# Patient Record
Sex: Male | Born: 1965 | Race: Black or African American | Hispanic: No | Marital: Married | Smoking: Current every day smoker
Health system: Southern US, Community
[De-identification: ages and names within clinical notes are randomized; demographics above are authoritative.]

## PROBLEM LIST (undated history)

## (undated) DIAGNOSIS — E559 Vitamin D deficiency, unspecified: Secondary | ICD-10-CM

## (undated) DIAGNOSIS — I10 Essential (primary) hypertension: Secondary | ICD-10-CM

## (undated) DIAGNOSIS — E781 Pure hyperglyceridemia: Secondary | ICD-10-CM

## (undated) DIAGNOSIS — E7800 Pure hypercholesterolemia, unspecified: Secondary | ICD-10-CM

## (undated) DIAGNOSIS — R7303 Prediabetes: Secondary | ICD-10-CM

## (undated) DIAGNOSIS — R000 Tachycardia, unspecified: Secondary | ICD-10-CM

## (undated) DIAGNOSIS — Z720 Tobacco use: Secondary | ICD-10-CM

## (undated) HISTORY — PX: OTHER SURGICAL HISTORY: SHX169

## (undated) HISTORY — DX: Vitamin D deficiency, unspecified: E55.9

## (undated) HISTORY — PX: SHOULDER SURGERY: SHX246

---

## 2012-12-25 ENCOUNTER — Encounter (HOSPITAL_COMMUNITY): Payer: Self-pay | Admitting: Emergency Medicine

## 2012-12-25 ENCOUNTER — Emergency Department (HOSPITAL_COMMUNITY): Payer: Worker's Compensation

## 2012-12-25 ENCOUNTER — Emergency Department (HOSPITAL_COMMUNITY)
Admission: EM | Admit: 2012-12-25 | Discharge: 2012-12-25 | Disposition: A | Discharge status: Home / Self Care | Payer: Worker's Compensation | Attending: Emergency Medicine | Admitting: Emergency Medicine

## 2012-12-25 DIAGNOSIS — IMO0001 Reserved for inherently not codable concepts without codable children: Secondary | ICD-10-CM | POA: Insufficient documentation

## 2012-12-25 DIAGNOSIS — Z79899 Other long term (current) drug therapy: Secondary | ICD-10-CM | POA: Insufficient documentation

## 2012-12-25 DIAGNOSIS — Y939 Activity, unspecified: Secondary | ICD-10-CM | POA: Insufficient documentation

## 2012-12-25 DIAGNOSIS — M7710 Lateral epicondylitis, unspecified elbow: Secondary | ICD-10-CM | POA: Insufficient documentation

## 2012-12-25 DIAGNOSIS — W230XXA Caught, crushed, jammed, or pinched between moving objects, initial encounter: Secondary | ICD-10-CM | POA: Insufficient documentation

## 2012-12-25 DIAGNOSIS — Z87828 Personal history of other (healed) physical injury and trauma: Secondary | ICD-10-CM | POA: Insufficient documentation

## 2012-12-25 DIAGNOSIS — R209 Unspecified disturbances of skin sensation: Secondary | ICD-10-CM | POA: Insufficient documentation

## 2012-12-25 DIAGNOSIS — F17200 Nicotine dependence, unspecified, uncomplicated: Secondary | ICD-10-CM | POA: Insufficient documentation

## 2012-12-25 DIAGNOSIS — Y929 Unspecified place or not applicable: Secondary | ICD-10-CM | POA: Insufficient documentation

## 2012-12-25 DIAGNOSIS — M7711 Lateral epicondylitis, right elbow: Secondary | ICD-10-CM
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
## 2012-12-25 NOTE — ED Notes (Signed)
Pt reports hit right elbow on back of truck 2 weeks ago. Pt c/o pain to right shoulder and numbness to right hand and fingers.

## 2012-12-25 NOTE — ED Notes (Signed)
PT ambulated with baseline gait; VSS; A&Ox3; no signs of distress; respirations even and unlabored; skin warm and dry; no questions upon discharge.  

## 2012-12-26 NOTE — ED Provider Notes (Signed)
Medical screening examination/treatment/procedure(s) were performed by non-physician practitioner and as supervising physician I was immediately available for consultation/collaboration.   Jovann Luse, MD 12/26/12 1418 

## 2013-12-13 ENCOUNTER — Emergency Department (HOSPITAL_COMMUNITY)
Admission: EM | Admit: 2013-12-13 | Discharge: 2013-12-13 | Disposition: A | Discharge status: Home / Self Care | Payer: BC Managed Care – PPO | Attending: Emergency Medicine | Admitting: Emergency Medicine

## 2013-12-13 ENCOUNTER — Encounter (HOSPITAL_COMMUNITY): Payer: Self-pay | Admitting: Emergency Medicine

## 2013-12-13 DIAGNOSIS — F17200 Nicotine dependence, unspecified, uncomplicated: Secondary | ICD-10-CM | POA: Insufficient documentation

## 2013-12-13 DIAGNOSIS — R252 Cramp and spasm: Secondary | ICD-10-CM | POA: Insufficient documentation

## 2013-12-13 DIAGNOSIS — Z87828 Personal history of other (healed) physical injury and trauma: Secondary | ICD-10-CM | POA: Insufficient documentation

## 2013-12-13 DIAGNOSIS — Z880 Allergy status to penicillin: Secondary | ICD-10-CM | POA: Insufficient documentation

## 2013-12-13 DIAGNOSIS — Z79899 Other long term (current) drug therapy: Secondary | ICD-10-CM | POA: Insufficient documentation

## 2013-12-13 LAB — BASIC METABOLIC PANEL
Anion gap: 15 (ref 5–15)
BUN: 27 mg/dL — AB (ref 6–23)
CHLORIDE: 97 meq/L (ref 96–112)
CO2: 21 mEq/L (ref 19–32)
Calcium: 9.1 mg/dL (ref 8.4–10.5)
Creatinine, Ser: 1.09 mg/dL (ref 0.50–1.35)
GFR, EST NON AFRICAN AMERICAN: 80 mL/min — AB (ref >90)
Glucose, Bld: 108 mg/dL — ABNORMAL HIGH (ref 70–99)
POTASSIUM: 4.8 meq/L (ref 3.7–5.3)
SODIUM: 133 meq/L — AB (ref 137–147)

## 2013-12-13 LAB — URINALYSIS, ROUTINE W REFLEX MICROSCOPIC
Bilirubin Urine: NEGATIVE
Glucose, UA: NEGATIVE mg/dL
HGB URINE DIPSTICK: NEGATIVE
Ketones, ur: NEGATIVE mg/dL
LEUKOCYTES UA: NEGATIVE
NITRITE: NEGATIVE
PROTEIN: NEGATIVE mg/dL
SPECIFIC GRAVITY, URINE: 1.019 (ref 1.005–1.030)
UROBILINOGEN UA: 0.2 mg/dL (ref 0.0–1.0)
pH: 5.5 (ref 5.0–8.0)

## 2013-12-13 LAB — CBC WITH DIFFERENTIAL/PLATELET
BASOS PCT: 0 % (ref 0–1)
Basophils Absolute: 0 10*3/uL (ref 0.0–0.1)
Eosinophils Absolute: 0.1 10*3/uL (ref 0.0–0.7)
Eosinophils Relative: 1 % (ref 0–5)
HCT: 42.3 % (ref 39.0–52.0)
HEMOGLOBIN: 14.6 g/dL (ref 13.0–17.0)
LYMPHS ABS: 2.5 10*3/uL (ref 0.7–4.0)
Lymphocytes Relative: 25 % (ref 12–46)
MCH: 30.4 pg (ref 26.0–34.0)
MCHC: 34.5 g/dL (ref 30.0–36.0)
MCV: 88.1 fL (ref 78.0–100.0)
MONOS PCT: 10 % (ref 3–12)
Monocytes Absolute: 1 10*3/uL (ref 0.1–1.0)
NEUTROS ABS: 6.3 10*3/uL (ref 1.7–7.7)
NEUTROS PCT: 64 % (ref 43–77)
PLATELETS: 166 10*3/uL (ref 150–400)
RBC: 4.8 MIL/uL (ref 4.22–5.81)
RDW: 13.7 % (ref 11.5–15.5)
WBC: 9.9 10*3/uL (ref 4.0–10.5)

## 2013-12-13 NOTE — Discharge Instructions (Signed)

## 2013-12-13 NOTE — ED Notes (Signed)
The pt is c/o bi-lateral leg cramps since he mowed his lawn earlier today.  Since he finished mowing he has continued to have the cramps he has been drinking water gatoraid also with no relief

## 2013-12-13 NOTE — ED Notes (Signed)
C/o generalized muscle cramps states he was outside in the heat all day.

## 2013-12-13 NOTE — ED Notes (Signed)
Patient left before getting discharge instructions. States he had somewhere to be.

## 2013-12-13 NOTE — ED Provider Notes (Signed)
Patient developed cramps in both lower legs today after mowing the lawn in the heat. He is asymptomatic now without treatment. Symptoms consistent with heat cramps  Christian SouSam Marella Vanderpol, MD 12/13/13 2215
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
## 2013-12-21 NOTE — ED Provider Notes (Signed)
I have personally seen and examined the patient.  I have discussed the plan of care with the resident.  I have reviewed the documentation on PMH/FH/Soc. History.  I have reviewed the documentation of the resident and agree.  Doug SouSam Tomer Chalmers, MD 12/21/13 2019

## 2015-02-28 ENCOUNTER — Emergency Department (INDEPENDENT_AMBULATORY_CARE_PROVIDER_SITE_OTHER)
Admission: EM | Admit: 2015-02-28 | Discharge: 2015-02-28 | Disposition: A | Discharge status: Home / Self Care | Payer: Managed Care, Other (non HMO) | Source: Home / Self Care | Attending: Emergency Medicine | Admitting: Emergency Medicine

## 2015-02-28 ENCOUNTER — Encounter (HOSPITAL_COMMUNITY): Payer: Self-pay | Admitting: Emergency Medicine

## 2015-02-28 DIAGNOSIS — I10 Essential (primary) hypertension: Secondary | ICD-10-CM

## 2015-02-28 DIAGNOSIS — E785 Hyperlipidemia, unspecified: Secondary | ICD-10-CM | POA: Diagnosis not present

## 2015-02-28 HISTORY — DX: Pure hypercholesterolemia, unspecified: E78.00

## 2015-02-28 HISTORY — DX: Essential (primary) hypertension: I10

## 2015-02-28 LAB — POCT I-STAT, CHEM 8
BUN: 23 mg/dL — ABNORMAL HIGH (ref 6–20)
CHLORIDE: 105 mmol/L (ref 101–111)
Calcium, Ion: 1.25 mmol/L — ABNORMAL HIGH (ref 1.12–1.23)
Creatinine, Ser: 1.2 mg/dL (ref 0.61–1.24)
Glucose, Bld: 89 mg/dL (ref 65–99)
HEMATOCRIT: 42 % (ref 39.0–52.0)
HEMOGLOBIN: 14.3 g/dL (ref 13.0–17.0)
POTASSIUM: 4.4 mmol/L (ref 3.5–5.1)
Sodium: 139 mmol/L (ref 135–145)
TCO2: 26 mmol/L (ref 0–100)

## 2015-02-28 MED ORDER — ENALAPRIL MALEATE 20 MG PO TABS: 20.0000 mg | ORAL_TABLET | Freq: Every day | ORAL | Status: AC

## 2015-02-28 NOTE — Discharge Instructions (Signed)
Dyslipidemia Dyslipidemia is an imbalance of the lipids in your blood. Lipids are waxy, fat-like proteins that your body needs in small amounts. Dyslipidemia often involves the lipids cholesterol or triglycerides. Common forms of dyslipidemia are:  High levels of bad cholesterol (LDL cholesterol). LDL cholesterol is the type of cholesterol that causes heart disease.  Low levels of good cholesterol (HDL cholesterol). HDL cholesterol is the type of cholesterol that helps protect against heart disease.  High levels of triglycerides. Triglycerides are a fatty substance in the blood linked to a buildup of plaque on your arteries. RISK FACTORS  Increased age.  Having a family history of high cholesterol.  Certain medicines, including birth control pills, diuretics, beta-blockers, and some medicines for depression.  Smoking.  Eating a high-fat diet.  Being overweight.  Medical conditions such as diabetes, polycystic ovary syndrome, pregnancy, kidney disease, and hypothyroidism.  Lack of regular exercise. SIGNS AND SYMPTOMS There are no signs or symptoms with dyslipidemia.  DIAGNOSIS  A simple blood test called a fasting blood test can be done to determine your level of:  Total cholesterol. This is the combined number of LDL cholesterol and HDL cholesterol. A healthy number is lower than 200.  LDL cholesterol. The goal number for LDL cholesterol is different for each person depending on risk factors. Ask your health care provider what your LDL cholesterol number should be.  HDL cholesterol. A healthy level of HDL cholesterol is 60 or higher. A number lower than 40 for men or 50 for women is a danger sign.  Triglycerides. A healthy triglyceride number is less than 150. TREATMENT  Dyslipidemia is a treatable condition. Your health care provider will advise you on what type of treatment is best based on your age, your test results, and current guidelines. Treatment may include:   Dietary  changes. A dietitian can help you create a meal plan. You may need to:  Eat more foods that contain omega-3s, such as salmon and other fish.  Replace saturated fats and trans fats in your diet with healthy fats such as nuts, seeds, avocados, olive oil, and canola oil.  Regular exercise. This can help lower your LDL cholesterol, raise your HDL cholesterol, and help with weight management. Check with your health care provider before beginning an exercise program. Most people should participate in 30 minutes of brisk exercise 5 days a week.  Quitting smoking.  Medicines to lower LDL cholesterol and triglycerides. Your health care provider will monitor your lipid levels with regular blood tests. HOME CARE INSTRUCTIONS  Eat a healthy diet. Follow any diet instructions if they were given to you by your health care provider.  Maintain a healthy weight.  Exercise regularly based on the recommendations of your health care provider.  Do not use any tobacco products, including cigarettes, chewing tobacco, or electronic cigarettes.  Take medicines only as directed by your health care provider.  Keep all follow-up visits as directed by your health care provider. SEEK MEDICAL CARE IF: You are having possible side effects from your medicines. Document Released: 05/27/2013 Document Revised: 10/06/2013 Document Reviewed: 05/27/2013 [employer] Patient Information 2015 [employer]. This information is not intended to replace advice given to you by your health care provider. Make sure you discuss any questions you have with your health care provider. Hypertension Hypertension, commonly called high blood pressure, is when the force of blood pumping through your arteries is too strong. Your arteries are the blood vessels that carry blood from your heart throughout your body. A   A blood pressure reading consists of a higher number over a lower number, such as 110/72. The higher number (systolic) is the  pressure inside your arteries when your heart pumps. The lower number (diastolic) is the pressure inside your arteries when your heart relaxes. Ideally you want your blood pressure below 120/80. Hypertension forces your heart to work harder to pump blood. Your arteries may become narrow or stiff. Having hypertension puts you at risk for heart disease, stroke, and other problems.  RISK FACTORS Some risk factors for high blood pressure are controllable. Others are not.  Risk factors you cannot control include:   Race. You may be at higher risk if you are African American.  Age. Risk increases with age.  Gender. Men are at higher risk than women before age 77 years. After age 96, women are at higher risk than men. Risk factors you can control include:  Not getting enough exercise or physical activity.  Being overweight.  Getting too much fat, sugar, calories, or salt in your diet.  Drinking too much alcohol. SIGNS AND SYMPTOMS Hypertension does not usually cause signs or symptoms. Extremely high blood pressure (hypertensive crisis) may cause headache, anxiety, shortness of breath, and nosebleed. DIAGNOSIS  To check if you have hypertension, your health care provider will measure your blood pressure while you are seated, with your arm held at the level of your heart. It should be measured at least twice using the same arm. Certain conditions can cause a difference in blood pressure between your right and left arms. A blood pressure reading that is higher than normal on one occasion does not mean that you need treatment. If one blood pressure reading is high, ask your health care provider about having it checked again. TREATMENT  Treating high blood pressure includes making lifestyle changes and possibly taking medicine. Living a healthy lifestyle can help lower high blood pressure. You may need to change some of your habits. Lifestyle changes may include:  Following the DASH diet. This diet  is high in fruits, vegetables, and whole grains. It is low in salt, red meat, and added sugars.  Getting at least 2 hours of brisk physical activity every week.  Losing weight if necessary.  Not smoking.  Limiting alcoholic beverages.  Learning ways to reduce stress. If lifestyle changes are not enough to get your blood pressure under control, your health care provider may prescribe medicine. You may need to take more than one. Work closely with your health care provider to understand the risks and benefits. HOME CARE INSTRUCTIONS  Have your blood pressure rechecked as directed by your health care provider.   Take medicines only as directed by your health care provider. Follow the directions carefully. Blood pressure medicines must be taken as prescribed. The medicine does not work as well when you skip doses. Skipping doses also puts you at risk for problems.   Do not smoke.   Monitor your blood pressure at home as directed by your health care provider. SEEK MEDICAL CARE IF:   You think you are having a reaction to medicines taken.  You have recurrent headaches or feel dizzy.  You have swelling in your ankles.  You have trouble with your vision. SEEK IMMEDIATE MEDICAL CARE IF:  You develop a severe headache or confusion.  You have unusual weakness, numbness, or feel faint.  You have severe chest or abdominal pain.  You vomit repeatedly.  You have trouble breathing. MAKE SURE YOU:   Understand these instructions.  Will watch your condition.  Will get help right away if you are not doing well or get worse. Document Released: 05/22/2005 Document Revised: 10/06/2013 Document Reviewed: 03/14/2013 [facility] Patient Information 2015 [place], [place]. This information is not intended to replace advice given to you by your health care provider. Make sure you discuss any questions you have with your health care provider.

## 2015-02-28 NOTE — ED Notes (Signed)
Patient is requesting medication refill.  Patient was once a patient at [facility], but is no longer being seen there.  Patient requesting refill of atorvastatin and enalapril.  Last took these medicines 9/15
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
## 2015-05-24 ENCOUNTER — Encounter (HOSPITAL_COMMUNITY): Payer: Self-pay | Admitting: Family Medicine

## 2015-05-24 ENCOUNTER — Emergency Department (HOSPITAL_COMMUNITY)
Admission: EM | Admit: 2015-05-24 | Discharge: 2015-05-24 | Disposition: A | Discharge status: Home / Self Care | Payer: Managed Care, Other (non HMO) | Attending: Emergency Medicine | Admitting: Emergency Medicine

## 2015-05-24 ENCOUNTER — Emergency Department (HOSPITAL_COMMUNITY): Payer: Managed Care, Other (non HMO)

## 2015-05-24 DIAGNOSIS — R202 Paresthesia of skin: Secondary | ICD-10-CM | POA: Insufficient documentation

## 2015-05-24 DIAGNOSIS — Z880 Allergy status to penicillin: Secondary | ICD-10-CM | POA: Diagnosis not present

## 2015-05-24 DIAGNOSIS — R079 Chest pain, unspecified: Secondary | ICD-10-CM | POA: Diagnosis present

## 2015-05-24 DIAGNOSIS — I10 Essential (primary) hypertension: Secondary | ICD-10-CM | POA: Insufficient documentation

## 2015-05-24 DIAGNOSIS — Z79899 Other long term (current) drug therapy: Secondary | ICD-10-CM | POA: Diagnosis not present

## 2015-05-24 DIAGNOSIS — R0789 Other chest pain: Secondary | ICD-10-CM | POA: Diagnosis not present

## 2015-05-24 DIAGNOSIS — E7800 Pure hypercholesterolemia, unspecified: Secondary | ICD-10-CM | POA: Diagnosis not present

## 2015-05-24 DIAGNOSIS — F17200 Nicotine dependence, unspecified, uncomplicated: Secondary | ICD-10-CM | POA: Insufficient documentation

## 2015-05-24 LAB — CBC
HCT: 44.6 % (ref 39.0–52.0)
HEMOGLOBIN: 15.2 g/dL (ref 13.0–17.0)
MCH: 29.9 pg (ref 26.0–34.0)
MCHC: 34.1 g/dL (ref 30.0–36.0)
MCV: 87.6 fL (ref 78.0–100.0)
PLATELETS: 187 10*3/uL (ref 150–400)
RBC: 5.09 MIL/uL (ref 4.22–5.81)
RDW: 13.8 % (ref 11.5–15.5)
WBC: 10.6 10*3/uL — ABNORMAL HIGH (ref 4.0–10.5)

## 2015-05-24 LAB — BASIC METABOLIC PANEL
ANION GAP: 10 (ref 5–15)
BUN: 16 mg/dL (ref 6–20)
CALCIUM: 9.6 mg/dL (ref 8.9–10.3)
CO2: 23 mmol/L (ref 22–32)
CREATININE: 1.07 mg/dL (ref 0.61–1.24)
Chloride: 105 mmol/L (ref 101–111)
Glucose, Bld: 98 mg/dL (ref 65–99)
Potassium: 4 mmol/L (ref 3.5–5.1)
SODIUM: 138 mmol/L (ref 135–145)

## 2015-05-24 LAB — I-STAT TROPONIN, ED
TROPONIN I, POC: 0 ng/mL (ref 0.00–0.08)
TROPONIN I, POC: 0.01 ng/mL (ref 0.00–0.08)

## 2015-05-24 NOTE — Discharge Instructions (Signed)
It was our pleasure to provide your ER care today - we hope that you feel better.  Follow up with cardiologist in the coming week - see referral - call office to arrange appointment - based upon your history/family history, discuss stress testing at follow up visit.  Your chest xray was read as follows:   IMPRESSION: 1.  No acute cardiopulmonary abnormality. 2. Mild impression on the right trachea at the thoracic inlet suggestive of right thyroid goiter. Electronically Signed   By: Odessa FlemingH  Hall M.D.   On: 05/24/2015 16:51 Follow up with primary care doctor in the next 1-2 weeks - have them recheck your thyroid, and/or consider ultrasound.     Return to ER right away if worse, new symptoms, trouble breathing, persistent or recurrent chest pain, chest pain or trouble breathing with exertion, other concern.     Nonspecific Chest Pain  Chest pain can be caused by many different conditions. There is always a chance that your pain could be related to something serious, such as a heart attack or a blood clot in your lungs. Chest pain can also be caused by conditions that are not life-threatening. If you have chest pain, it is very important to follow up with your health care provider. CAUSES  Chest pain can be caused by:  Heartburn.  Pneumonia or bronchitis.  Anxiety or stress.  Inflammation around your heart (pericarditis) or lung (pleuritis or pleurisy).  A blood clot in your lung.  A collapsed lung (pneumothorax). It can develop suddenly on its own (spontaneous pneumothorax) or from trauma to the chest.  Shingles infection (varicella-zoster virus).  Heart attack.  Damage to the bones, muscles, and cartilage that make up your chest wall. This can include:  Bruised bones due to injury.  Strained muscles or cartilage due to frequent or repeated coughing or overwork.  Fracture to one or more ribs.  Sore cartilage due to inflammation (costochondritis). RISK FACTORS  Risk factors for  chest pain may include:  Activities that increase your risk for trauma or injury to your chest.  Respiratory infections or conditions that cause frequent coughing.  Medical conditions or overeating that can cause heartburn.  Heart disease or family history of heart disease.  Conditions or health behaviors that increase your risk of developing a blood clot.  Having had chicken pox (varicella zoster). SIGNS AND SYMPTOMS Chest pain can feel like:  Burning or tingling on the surface of your chest or deep in your chest.  Crushing, pressure, aching, or squeezing pain.  Dull or sharp pain that is worse when you move, cough, or take a deep breath.  Pain that is also felt in your back, neck, shoulder, or arm, or pain that spreads to any of these areas. Your chest pain may come and go, or it may stay constant. DIAGNOSIS Lab tests or other studies may be needed to find the cause of your pain. Your health care provider may have you take a test called an ambulatory ECG (electrocardiogram). An ECG records your heartbeat patterns at the time the test is performed. You may also have other tests, such as:  Transthoracic echocardiogram (TTE). During echocardiography, sound waves are used to create a picture of all of the heart structures and to look at how blood flows through your heart.  Transesophageal echocardiogram (TEE).This is a more advanced imaging test that obtains images from inside your body. It allows your health care provider to see your heart in finer detail.  Cardiac monitoring. This allows  your health care provider to monitor your heart rate and rhythm in real time.  Holter monitor. This is a portable device that records your heartbeat and can help to diagnose abnormal heartbeats. It allows your health care provider to track your heart activity for several days, if needed.  Stress tests. These can be done through exercise or by taking medicine that makes your heart beat more  quickly.  Blood tests.  Imaging tests. TREATMENT  Your treatment depends on what is causing your chest pain. Treatment may include:  Medicines. These may include:  Acid blockers for heartburn.  Anti-inflammatory medicine.  Pain medicine for inflammatory conditions.  Antibiotic medicine, if an infection is present.  Medicines to dissolve blood clots.  Medicines to treat coronary artery disease.  Supportive care for conditions that do not require medicines. This may include:  Resting.  Applying heat or cold packs to injured areas.  Limiting activities until pain decreases. HOME CARE INSTRUCTIONS  If you were prescribed an antibiotic medicine, finish it all even if you start to feel better.  Avoid any activities that bring on chest pain.  Do not use any tobacco products, including cigarettes, chewing tobacco, or electronic cigarettes. If you need help quitting, ask your health care provider.  Do not drink alcohol.  Take medicines only as directed by your health care provider.  Keep all follow-up visits as directed by your health care provider. This is important. This includes any further testing if your chest pain does not go away.  If heartburn is the cause for your chest pain, you may be told to keep your head raised (elevated) while sleeping. This reduces the chance that acid will go from your stomach into your esophagus.  Make lifestyle changes as directed by your health care provider. These may include:  Getting regular exercise. Ask your health care provider to suggest some activities that are safe for you.  Eating a heart-healthy diet. A registered dietitian can help you to learn healthy eating options.  Maintaining a healthy weight.  Managing diabetes, if necessary.  Reducing stress. SEEK MEDICAL CARE IF:  Your chest pain does not go away after treatment.  You have a rash with blisters on your chest.  You have a fever. SEEK IMMEDIATE MEDICAL CARE  IF:   Your chest pain is worse.  You have an increasing cough, or you cough up blood.  You have severe abdominal pain.  You have severe weakness.  You faint.  You have chills.  You have sudden, unexplained chest discomfort.  You have sudden, unexplained discomfort in your arms, back, neck, or jaw.  You have shortness of breath at any time.  You suddenly start to sweat, or your skin gets clammy.  You feel nauseous or you vomit.  You suddenly feel light-headed or dizzy.  Your heart begins to beat quickly, or it feels like it is skipping beats. These symptoms may represent a serious problem that is an emergency. Do not wait to see if the symptoms will go away. Get medical help right away. Call your local emergency services (911 in the [place] Do not drive yourself to the hospital.   This information is not intended to replace advice given to you by your health care provider. Make sure you discuss any questions you have with your health care provider.   Document Released: 03/01/2005 Document Revised: 06/12/2014 Document Reviewed: 12/26/2013 Elsevier Interactive Patient Education [employer]

## 2015-05-24 NOTE — ED Notes (Signed)
Pt here for chest pain since Friday. sts intermittent and radiating into left arm and numbness and tingling. Denies SOB, nausea.
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
## 2015-06-23 ENCOUNTER — Ambulatory Visit: Payer: BLUE CROSS/BLUE SHIELD | Admitting: Cardiovascular Disease

## 2015-07-14 ENCOUNTER — Encounter: Payer: Self-pay | Admitting: Cardiovascular Disease

## 2015-07-23 ENCOUNTER — Ambulatory Visit: Payer: BLUE CROSS/BLUE SHIELD | Admitting: Cardiovascular Disease

## 2015-08-27 DIAGNOSIS — F17200 Nicotine dependence, unspecified, uncomplicated: Secondary | ICD-10-CM | POA: Insufficient documentation

## 2015-08-27 DIAGNOSIS — E559 Vitamin D deficiency, unspecified: Secondary | ICD-10-CM | POA: Insufficient documentation

## 2015-08-27 DIAGNOSIS — Z8042 Family history of malignant neoplasm of prostate: Secondary | ICD-10-CM | POA: Insufficient documentation

## 2016-03-04 ENCOUNTER — Encounter (HOSPITAL_COMMUNITY): Payer: Self-pay | Admitting: Emergency Medicine

## 2016-03-04 ENCOUNTER — Ambulatory Visit (HOSPITAL_COMMUNITY)
Admission: EM | Admit: 2016-03-04 | Discharge: 2016-03-04 | Disposition: A | Discharge status: Home / Self Care | Payer: BLUE CROSS/BLUE SHIELD | Attending: Family Medicine | Admitting: Family Medicine

## 2016-03-04 DIAGNOSIS — J069 Acute upper respiratory infection, unspecified: Secondary | ICD-10-CM | POA: Diagnosis not present

## 2016-03-04 MED ORDER — BENZONATATE 100 MG PO CAPS: 200.0000 mg | ORAL_CAPSULE | Freq: Three times a day (TID) | ORAL | 0 refills | Status: AC | PRN

## 2016-03-04 NOTE — Discharge Instructions (Signed)
Follow up with PCP for BP recheck next week.   For your cold symptoms today: Rest, hydration, good hand washing, Take Robitussin or Delsym for coughing. The prescription is also for cough and you may take it as well if OTC cough syrup does not work. May take mucinex to loosen mucus. May take nasal decongestant nasal spray such as Afrin for decongestant but only limit the use of this nasal spry for 5 days.

## 2016-03-04 NOTE — ED Triage Notes (Signed)
Patient has cough, sneezing, sore throat, non-productive cough, general soreness, both ears hurt and NO fever.  Symptoms for 4 days
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
## 2016-10-09 DIAGNOSIS — E119 Type 2 diabetes mellitus without complications: Secondary | ICD-10-CM | POA: Insufficient documentation

## 2018-12-20 ENCOUNTER — Observation Stay (HOSPITAL_COMMUNITY)
Admission: EM | Admit: 2018-12-20 | Discharge: 2018-12-21 | Disposition: A | Discharge status: Home / Self Care | Payer: BLUE CROSS/BLUE SHIELD | Attending: Family Medicine | Admitting: Family Medicine

## 2018-12-20 ENCOUNTER — Other Ambulatory Visit: Payer: Self-pay

## 2018-12-20 ENCOUNTER — Emergency Department (HOSPITAL_COMMUNITY): Payer: BLUE CROSS/BLUE SHIELD

## 2018-12-20 DIAGNOSIS — F17200 Nicotine dependence, unspecified, uncomplicated: Secondary | ICD-10-CM | POA: Diagnosis not present

## 2018-12-20 DIAGNOSIS — E119 Type 2 diabetes mellitus without complications: Secondary | ICD-10-CM | POA: Insufficient documentation

## 2018-12-20 DIAGNOSIS — E785 Hyperlipidemia, unspecified: Secondary | ICD-10-CM | POA: Diagnosis not present

## 2018-12-20 DIAGNOSIS — R0789 Other chest pain: Secondary | ICD-10-CM | POA: Diagnosis present

## 2018-12-20 DIAGNOSIS — Z20828 Contact with and (suspected) exposure to other viral communicable diseases: Secondary | ICD-10-CM | POA: Diagnosis not present

## 2018-12-20 DIAGNOSIS — Z79899 Other long term (current) drug therapy: Secondary | ICD-10-CM | POA: Diagnosis not present

## 2018-12-20 DIAGNOSIS — I10 Essential (primary) hypertension: Secondary | ICD-10-CM

## 2018-12-20 DIAGNOSIS — R079 Chest pain, unspecified: Secondary | ICD-10-CM | POA: Diagnosis present

## 2018-12-20 HISTORY — DX: Tobacco use: Z72.0

## 2018-12-20 HISTORY — DX: Pure hyperglyceridemia: E78.1

## 2018-12-20 HISTORY — DX: Tachycardia, unspecified: R00.0

## 2018-12-20 HISTORY — DX: Prediabetes: R73.03

## 2018-12-20 LAB — CBC WITH DIFFERENTIAL/PLATELET
Abs Immature Granulocytes: 0.02 10*3/uL (ref 0.00–0.07)
Basophils Absolute: 0 10*3/uL (ref 0.0–0.1)
Basophils Relative: 0 %
Eosinophils Absolute: 0.1 10*3/uL (ref 0.0–0.5)
Eosinophils Relative: 1 %
HCT: 42.6 % (ref 39.0–52.0)
Hemoglobin: 14.4 g/dL (ref 13.0–17.0)
Immature Granulocytes: 0 %
Lymphocytes Relative: 33 %
Lymphs Abs: 2.9 10*3/uL (ref 0.7–4.0)
MCH: 30.8 pg (ref 26.0–34.0)
MCHC: 33.8 g/dL (ref 30.0–36.0)
MCV: 91.2 fL (ref 80.0–100.0)
Monocytes Absolute: 0.8 10*3/uL (ref 0.1–1.0)
Monocytes Relative: 9 %
Neutro Abs: 5.2 10*3/uL (ref 1.7–7.7)
Neutrophils Relative %: 57 %
Platelets: 178 10*3/uL (ref 150–400)
RBC: 4.67 MIL/uL (ref 4.22–5.81)
RDW: 14.1 % (ref 11.5–15.5)
WBC: 9.1 10*3/uL (ref 4.0–10.5)
nRBC: 0 % (ref 0.0–0.2)

## 2018-12-20 LAB — BASIC METABOLIC PANEL
Anion gap: 13 (ref 5–15)
BUN: 19 mg/dL (ref 6–20)
CO2: 21 mmol/L — ABNORMAL LOW (ref 22–32)
Calcium: 9.4 mg/dL (ref 8.9–10.3)
Chloride: 101 mmol/L (ref 98–111)
Creatinine, Ser: 1.3 mg/dL — ABNORMAL HIGH (ref 0.61–1.24)
GFR calc Af Amer: >60 mL/min (ref >60)
GFR calc non Af Amer: >60 mL/min (ref >60)
Glucose, Bld: 146 mg/dL — ABNORMAL HIGH (ref 70–99)
Potassium: 3.8 mmol/L (ref 3.5–5.1)
Sodium: 135 mmol/L (ref 135–145)

## 2018-12-20 LAB — TROPONIN I (HIGH SENSITIVITY): Troponin I (High Sensitivity): 5 ng/L (ref <18)

## 2018-12-20 NOTE — ED Triage Notes (Signed)
Per pt he has been having SOB for 2 days that had gotten worse today. Pt says hard to take a deep breath. No fevers no chills, No chest pain

## 2018-12-21 ENCOUNTER — Encounter (HOSPITAL_COMMUNITY): Payer: Self-pay | Admitting: Internal Medicine

## 2018-12-21 ENCOUNTER — Observation Stay (HOSPITAL_BASED_OUTPATIENT_CLINIC_OR_DEPARTMENT_OTHER): Payer: BLUE CROSS/BLUE SHIELD

## 2018-12-21 ENCOUNTER — Other Ambulatory Visit: Payer: Self-pay | Admitting: Physician Assistant

## 2018-12-21 DIAGNOSIS — Z20828 Contact with and (suspected) exposure to other viral communicable diseases: Secondary | ICD-10-CM | POA: Diagnosis not present

## 2018-12-21 DIAGNOSIS — R079 Chest pain, unspecified: Secondary | ICD-10-CM | POA: Diagnosis not present

## 2018-12-21 DIAGNOSIS — I10 Essential (primary) hypertension: Secondary | ICD-10-CM | POA: Diagnosis not present

## 2018-12-21 DIAGNOSIS — E785 Hyperlipidemia, unspecified: Secondary | ICD-10-CM | POA: Diagnosis present

## 2018-12-21 DIAGNOSIS — R0789 Other chest pain: Secondary | ICD-10-CM

## 2018-12-21 DIAGNOSIS — E119 Type 2 diabetes mellitus without complications: Secondary | ICD-10-CM | POA: Diagnosis not present

## 2018-12-21 LAB — RAPID URINE DRUG SCREEN, HOSP PERFORMED
Amphetamines: NOT DETECTED
Barbiturates: NOT DETECTED
Benzodiazepines: NOT DETECTED
Cocaine: NOT DETECTED
Opiates: NOT DETECTED
Tetrahydrocannabinol: NOT DETECTED

## 2018-12-21 LAB — TROPONIN I (HIGH SENSITIVITY): Troponin I (High Sensitivity): 4 ng/L (ref <18)

## 2018-12-21 LAB — COMPREHENSIVE METABOLIC PANEL
ALT: 37 U/L (ref 0–44)
AST: 31 U/L (ref 15–41)
Albumin: 3.6 g/dL (ref 3.5–5.0)
Alkaline Phosphatase: 51 U/L (ref 38–126)
Anion gap: 11 (ref 5–15)
BUN: 21 mg/dL — ABNORMAL HIGH (ref 6–20)
CO2: 23 mmol/L (ref 22–32)
Calcium: 9.1 mg/dL (ref 8.9–10.3)
Chloride: 102 mmol/L (ref 98–111)
Creatinine, Ser: 1.21 mg/dL (ref 0.61–1.24)
GFR calc Af Amer: >60 mL/min (ref >60)
GFR calc non Af Amer: >60 mL/min (ref >60)
Glucose, Bld: 142 mg/dL — ABNORMAL HIGH (ref 70–99)
Potassium: 3.7 mmol/L (ref 3.5–5.1)
Sodium: 136 mmol/L (ref 135–145)
Total Bilirubin: 0.4 mg/dL (ref 0.3–1.2)
Total Protein: 6.2 g/dL — ABNORMAL LOW (ref 6.5–8.1)

## 2018-12-21 LAB — CBC
HCT: 39 % (ref 39.0–52.0)
Hemoglobin: 13.9 g/dL (ref 13.0–17.0)
MCH: 32.6 pg (ref 26.0–34.0)
MCHC: 35.6 g/dL (ref 30.0–36.0)
MCV: 91.5 fL (ref 80.0–100.0)
Platelets: 161 10*3/uL (ref 150–400)
RBC: 4.26 MIL/uL (ref 4.22–5.81)
RDW: 14.2 % (ref 11.5–15.5)
WBC: 6.3 10*3/uL (ref 4.0–10.5)
nRBC: 0 % (ref 0.0–0.2)

## 2018-12-21 LAB — ECHOCARDIOGRAM COMPLETE
Height: 75 in
Weight: 4299.2 oz

## 2018-12-21 LAB — D-DIMER, QUANTITATIVE: D-Dimer, Quant: 0.28 ug/mL-FEU (ref 0.00–0.50)

## 2018-12-21 LAB — SARS CORONAVIRUS 2 BY RT PCR (HOSPITAL ORDER, PERFORMED IN ~~LOC~~ HOSPITAL LAB): SARS Coronavirus 2: NEGATIVE

## 2018-12-21 LAB — HIV ANTIBODY (ROUTINE TESTING W REFLEX): HIV Screen 4th Generation wRfx: NONREACTIVE

## 2018-12-21 MED ORDER — ACETAMINOPHEN 325 MG PO TABS: 650.0000 mg | ORAL_TABLET | Freq: Four times a day (QID) | ORAL | Status: DC | PRN

## 2018-12-21 MED ORDER — ASPIRIN EC 81 MG PO TBEC
81.0000 mg | DELAYED_RELEASE_TABLET | Freq: Every day | ORAL | Status: DC
  Administered 2018-12-21: 81 mg via ORAL
  Filled 2018-12-21: qty 1

## 2018-12-21 MED ORDER — ATORVASTATIN CALCIUM 80 MG PO TABS: 80.0000 mg | ORAL_TABLET | Freq: Every day | ORAL | 0 refills | Status: DC

## 2018-12-21 MED ORDER — NITROGLYCERIN 0.4 MG SL SUBL: 0.4000 mg | SUBLINGUAL_TABLET | SUBLINGUAL | Status: DC | PRN

## 2018-12-21 MED ORDER — ASPIRIN 81 MG PO CHEW
324.0000 mg | CHEWABLE_TABLET | Freq: Once | ORAL | Status: AC
  Administered 2018-12-21: 324 mg via ORAL
  Filled 2018-12-21: qty 4

## 2018-12-21 MED ORDER — ENALAPRIL MALEATE 20 MG PO TABS
20.0000 mg | ORAL_TABLET | Freq: Every day | ORAL | Status: DC
  Administered 2018-12-21: 20 mg via ORAL
  Filled 2018-12-21: qty 1

## 2018-12-21 MED ORDER — ENOXAPARIN SODIUM 40 MG/0.4ML ~~LOC~~ SOLN: 40.0000 mg | Freq: Every day | SUBCUTANEOUS | Status: DC

## 2018-12-21 MED ORDER — NICOTINE 14 MG/24HR TD PT24: 14.0000 mg | MEDICATED_PATCH | Freq: Every day | TRANSDERMAL | 0 refills | Status: DC

## 2018-12-21 MED ORDER — ACETAMINOPHEN 650 MG RE SUPP: 650.0000 mg | Freq: Four times a day (QID) | RECTAL | Status: DC | PRN

## 2018-12-21 MED ORDER — ATORVASTATIN CALCIUM 80 MG PO TABS
80.0000 mg | ORAL_TABLET | Freq: Every day | ORAL | Status: DC
  Administered 2018-12-21: 80 mg via ORAL
  Filled 2018-12-21: qty 1

## 2018-12-21 MED ORDER — ONDANSETRON HCL 4 MG/2ML IJ SOLN: 4.0000 mg | Freq: Four times a day (QID) | INTRAMUSCULAR | Status: DC | PRN

## 2018-12-21 MED ORDER — ONDANSETRON HCL 4 MG PO TABS: 4.0000 mg | ORAL_TABLET | Freq: Four times a day (QID) | ORAL | Status: DC | PRN

## 2018-12-21 MED ORDER — HYDRALAZINE HCL 20 MG/ML IJ SOLN: 10.0000 mg | INTRAMUSCULAR | Status: DC | PRN

## 2018-12-21 MED ORDER — NICOTINE 14 MG/24HR TD PT24: 14.0000 mg | MEDICATED_PATCH | Freq: Every day | TRANSDERMAL | Status: DC

## 2018-12-21 NOTE — Progress Notes (Signed)
  Echocardiogram 2D Echocardiogram has been performed.  Christian Mcclain 12/21/2018, 3:19 PM
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
## 2018-12-21 NOTE — Discharge Instructions (Signed)
[facility] Your doctor has ordered this test to check the blood flow in your heart arteries.  Please arrive 15 minutes early for paperwork. The whole test will take several hours. You may want to bring reading material to remain occupied while undergoing different parts of the test.  Instructions:  No food/drink after midnight the night before.  It is OK to take your morning meds with a sip of water EXCEPT for those types of medicines listed below or otherwise instructed.  No caffeine/decaf products 24 hours before, including medicines such as Excedrin or Goody Powders. Call if there are any questions.   Wear comfortable clothes and shoes.   Special Medication Instructions:  Beta blockers such as metoprolol (Lopressor/Toprol XL), atenolol (Tenormin), carvedilol (Coreg), nebivolol (Bystolic), bisoprolol (Zebeta), propranolol (Inderal) should not be taken for 24 hours before the test.  Calcium channel blockers such as diltiazem (Cardizem) or verapmil (Calan) should not be taken for 24 hours before the test.  Remove nitroglycerin patches and do not take nitrate preparations such as Imdur/isosorbide the day of your test.  No Persantine/Theophylline or Aggrenox medicines should be used within 24 hours of the test.    What To Expect: When you arrive in the lab, the technician will inject a small amount of radioactive tracer into your arm through an IV while you are resting quietly. This helps us to form pictures of your heart. You will likely only feel a sting from the IV. After a waiting period, resting pictures will be obtained under a big camera. These are the "before" pictures.  Next, you will be prepped for the stress portion of the test. Right now with the Covid-19 pandemic, we are primarily performing the pharmacologic test instead of having you walk on a treadmill. You will receive a medicine through your IV that may  cause temporary nausea, flushing, shortness of breath and sometimes chest discomfort or vomiting. This is typically short-lived and usually resolves quickly. If you experience symptoms, that does not automatically mean the test is abnormal. Some patients do not experience any symptoms at all. Your blood pressure and heart rate will be monitored, and we will be watching your EKG on a computer screen for any changes. During this portion of the test, the radiologist will inject another small amount of radioactive tracer into your IV. After a waiting period, you will undergo a second set of pictures. These are the "after" pictures.  The doctor reading the test will compare the before-and-after images to look for evidence of heart blockages or heart weakness. The test usually takes 1 day to complete, but in certain instances (for example, if a patient is over a certain weight limit), the test may be done over the span of 2 days.   Heart-Healthy Eating Plan Heart-healthy meal planning includes:  Eating less unhealthy fats.  Eating more healthy fats.  Making other changes in your diet. Talk with your doctor or a diet specialist (dietitian) to create an eating plan that is right for you. What is my plan? Your doctor may recommend an eating plan that includes:  Total fat: ______% or less of total calories a day.  Saturated fat: ______% or less of total calories a day.  Cholesterol: less than _________mg a day. What are tips for following this plan? Cooking Avoid frying your food. Try to bake, boil, grill, or broil it instead. You can also reduce fat by:  Removing the  skin from poultry.  Removing all visible fats from meats.  Steaming vegetables in water or broth. Meal planning   At meals, divide your plate into four equal parts: ? Fill one-half of your plate with vegetables and green salads. ? Fill one-fourth of your plate with whole grains. ? Fill one-fourth of your plate with lean  protein foods.  Eat 4-5 servings of vegetables per day. A serving of vegetables is: ? 1 cup of raw or cooked vegetables. ? 2 cups of raw leafy greens.  Eat 4-5 servings of fruit per day. A serving of fruit is: ? 1 medium whole fruit. ?  cup of dried fruit. ?  cup of fresh, frozen, or canned fruit. ?  cup of 100% fruit juice.  Eat more foods that have soluble fiber. These are apples, broccoli, carrots, beans, peas, and barley. Try to get 20-30 g of fiber per day.  Eat 4-5 servings of nuts, legumes, and seeds per week: ? 1 serving of dried beans or legumes equals  cup after being cooked. ? 1 serving of nuts is  cup. ? 1 serving of seeds equals 1 tablespoon. General information  Eat more home-cooked food. Eat less restaurant, buffet, and fast food.  Limit or avoid alcohol.  Limit foods that are high in starch and sugar.  Avoid fried foods.  Lose weight if you are overweight.  Keep track of how much salt (sodium) you eat. This is important if you have high blood pressure. Ask your doctor to tell you more about this.  Try to add vegetarian meals each week. Fats  Choose healthy fats. These include olive oil and canola oil, flaxseeds, walnuts, almonds, and seeds.  Eat more omega-3 fats. These include salmon, mackerel, sardines, tuna, flaxseed oil, and ground flaxseeds. Try to eat fish at least 2 times each week.  Check food labels. Avoid foods with trans fats or high amounts of saturated fat.  Limit saturated fats. ? These are often found in animal products, such as meats, butter, and cream. ? These are also found in plant foods, such as palm oil, palm kernel oil, and coconut oil.  Avoid foods with partially hydrogenated oils in them. These have trans fats. Examples are stick margarine, some tub margarines, cookies, crackers, and other baked goods. What foods can I eat? Fruits All fresh, canned (in natural juice), or frozen fruits. Vegetables Fresh or frozen vegetables  (raw, steamed, roasted, or grilled). Green salads. Grains Most grains. Choose whole wheat and whole grains most of the time. Rice and pasta, including brown rice and pastas made with whole wheat. Meats and other proteins Lean, well-trimmed beef, veal, pork, and lamb. Chicken and [place] without skin. All fish and shellfish. Wild duck, rabbit, pheasant, and venison. Egg whites or low-cholesterol egg substitutes. Dried beans, peas, lentils, and tofu. Seeds and most nuts. Dairy Low-fat or nonfat cheeses, including ricotta and mozzarella. Skim or 1% milk that is liquid, powdered, or evaporated. Buttermilk that is made with low-fat milk. Nonfat or low-fat yogurt. Fats and oils Non-hydrogenated (trans-free) margarines. Vegetable oils, including soybean, sesame, sunflower, olive, peanut, safflower, corn, canola, and cottonseed. Salad dressings or mayonnaise made with a vegetable oil. Beverages Mineral water. Coffee and tea. Diet carbonated beverages. Sweets and desserts Sherbet, gelatin, and fruit ice. Small amounts of dark chocolate. Limit all sweets and desserts. Seasonings and condiments All seasonings and condiments. The items listed above may not be a complete list of foods and drinks you can eat. Contact a dietitian for  more options. What foods should I avoid? Fruits Canned fruit in heavy syrup. Fruit in cream or butter sauce. Fried fruit. Limit coconut. Vegetables Vegetables cooked in cheese, cream, or butter sauce. Fried vegetables. Grains Breads that are made with saturated or trans fats, oils, or whole milk. Croissants. Sweet rolls. Donuts. High-fat crackers, such as cheese crackers. Meats and other proteins Fatty meats, such as hot dogs, ribs, sausage, bacon, rib-eye roast or steak. High-fat deli meats, such as salami and bologna. Caviar. Domestic duck and goose. Organ meats, such as liver. Dairy Cream, sour cream, cream cheese, and creamed cottage cheese. Whole-milk cheeses. Whole or  2% milk that is liquid, evaporated, or condensed. Whole buttermilk. Cream sauce or high-fat cheese sauce. Yogurt that is made from whole milk. Fats and oils Meat fat, or shortening. Cocoa butter, hydrogenated oils, palm oil, coconut oil, palm kernel oil. Solid fats and shortenings, including bacon fat, salt pork, lard, and butter. Nondairy cream substitutes. Salad dressings with cheese or sour cream. Beverages Regular sodas and juice drinks with added sugar. Sweets and desserts Frosting. Pudding. Cookies. Cakes. Pies. Milk chocolate or white chocolate. Buttered syrups. Full-fat ice cream or ice cream drinks. The items listed above may not be a complete list of foods and drinks to avoid. Contact a dietitian for more information. Summary  Heart-healthy meal planning includes eating less unhealthy fats, eating more healthy fats, and making other changes in your diet.  Eat a balanced diet. This includes fruits and vegetables, low-fat or nonfat dairy, lean protein, nuts and legumes, whole grains, and heart-healthy oils and fats. This information is not intended to replace advice given to you by your health care provider. Make sure you discuss any questions you have with your health care provider. Document Released: 11/21/2011 Document Revised: 07/26/2017 Document Reviewed: 06/29/2017 Elsevier Patient Education  2020 [employer]

## 2018-12-21 NOTE — Progress Notes (Signed)
See full note from PA Patient examined chart reviewed 53 y.o. with atypical chest pain. R/O , normal CXR, ECG no acute changes.  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

## 2018-12-21 NOTE — Discharge Summary (Signed)
Physician Discharge Summary  Christian Mcclain TAV:[id number] DOB: 1966-05-06 DOA: 12/20/2018  PCP: System, Provider Not In  Admit date: 12/20/2018 Discharge date: 12/21/2018  Time spent: 22 minutes  Recommendations for Outpatient Follow-up:  1. Needs outpatient stress test-being arranged by cardiology 2. Consider addition of Chantix to nicotine patch already ordered-good evidence that this is supportive in tobacco cessation 3. Needs outpatient nutrition directed weight loss   Discharge Diagnoses:  Principal Problem:   Chest pain Active Problems:   Essential hypertension   HLD (hyperlipidemia)   Discharge Condition: Improved  Diet recommendation: Heart healthy  Filed Weights   12/21/18 0114 12/21/18 0209  Weight: 113.4 kg 121.9 kg    History of present illness:  53 year old African-American obese male BMI 74 known HTN HLD admitted with 2 to 3-day history of chest pain-typical features absent, confounder patient did heavy yard work 2 days prior to [employer] with a push more-was over an acre of land that he did he is on accustomed to doing this Symptoms on presentation worrisome-nonradiating pain but was consistent and constant without typical crescendo decrescendo features Troponins 5 and 4 CXR nonrevealing  Cardiology consulted-based on symptomatology felt this was more in keeping with atypical chest pain and no concerning issues noted on data-echocardiogram was ordered showing EF 55-37% grade 1 diastolic dysfunction (compensated not acute) and patient was felt to be stabilized from their perspective for discharge  I had a long chat with him about tobacco use prescribed him a nicotine patch and mention to him if his PCP is willing to follow him it would be beneficial to consider Chantix He understands clearly and met maximal hospital benefit from this admission  Discharge Exam: Vitals:   12/21/18 1048 12/21/18 1317  BP: 129/80 (!) 129/91  Pulse:  82  Resp:  16   Temp:  97.6 F (36.4 C)  SpO2:  100%    General: Awake alert pleasant no distress neck soft supple Mallampati 2 no JVD Cardiovascular: S1-S2 no murmur telemetry benign sinus rhythm Respiratory: Clinically clear no added sound Abdomen obese no organomegaly no rebound nontender No lower extremity edema Neurologically intact  Discharge Instructions   Discharge Instructions    Diet - low sodium heart healthy   Complete by: As directed    Discharge instructions   Complete by: As directed    Follow with Dr. Unk Pinto will give you his number to call if you do not hear from his office in ~ 1 week Increase your statin Ask you doc about Chantix---good evidence this helps with smoking cessat>just nicotine replacement Go easy o the lawn--take frequent breaks   Increase activity slowly   Complete by: As directed      Allergies as of 12/21/2018      Reactions   Penicillins Itching   Did it involve swelling of the face/tongue/throat, SOB, or low BP? No Did it involve sudden or severe rash/hives, skin peeling, or any reaction on the inside of your mouth or nose? No Did you need to seek medical attention at a hospital or doctor's office? No When did it last happen?more than 10 years If all above answers are "NO", may proceed with cephalosporin use.      Medication List    STOP taking these medications   omega-3 acid ethyl esters 1 g capsule Commonly known as: LOVAZA     TAKE these medications   atorvastatin 80 MG tablet Commonly known as: LIPITOR Take 1 tablet (80 mg total) by mouth daily. What changed:  medication strength  how much to take  Another medication with the same name was removed. Continue taking this medication, and follow the directions you see here.   enalapril 20 MG tablet Commonly known as: Vasotec Take 1 tablet (20 mg total) by mouth daily. What changed:   how much to take  when to take this   hydrochlorothiazide 25 MG tablet Commonly  known as: HYDRODIURIL Take 25 mg by mouth at bedtime.   metFORMIN 500 MG 24 hr tablet Commonly known as: GLUCOPHAGE-XR Take 500 mg by mouth at bedtime.   nicotine 14 mg/24hr patch Commonly known as: NICODERM CQ - dosed in mg/24 hours Place 1 patch (14 mg total) onto the skin daily.   Vitamin D-1000 Max St 25 MCG (1000 UT) tablet Generic drug: Cholecalciferol Take 1,000 Units by mouth at bedtime.      Allergies  Allergen Reactions  . Penicillins Itching    Did it involve swelling of the face/tongue/throat, SOB, or low BP? No Did it involve sudden or severe rash/hives, skin peeling, or any reaction on the inside of your mouth or nose? No Did you need to seek medical attention at a hospital or doctor's office? No When did it last happen?more than 10 years If all above answers are "NO", may proceed with cephalosporin use.    Follow-up Information    [place] [facility] Follow up.   Specialty: Cardiology Why: [facility] - the office will call you to arrange your stress test and follow-up visit. Please see end of this summary for stress test instructions. Contact information: [address], [place] [phone]           The results of significant diagnostics from this hospitalization (including imaging, microbiology, ancillary and laboratory) are listed below for reference.    Significant Diagnostic Studies: Dg Chest 2 View  Result Date: 12/20/2018 CLINICAL DATA:  Shortness of breath for 2 days, cough. History of hypertension EXAM: CHEST - 2 VIEW COMPARISON:  Radiograph 05/24/2015 FINDINGS: No consolidation, features of edema, pneumothorax, or effusion. Tracheal air column is unremarkable. Pulmonary vascularity is normally distributed. The cardiomediastinal contours are unremarkable. No acute osseous or soft tissue abnormality. IMPRESSION: No acute cardiopulmonary abnormality. Electronically Signed   By: Lovena Le M.D.   On: 12/20/2018 22:16    Microbiology: Recent Results (from the past 240 hour(s))  SARS Coronavirus 2 (CEPHEID - Performed in [facility] hospital lab), Hosp Order     Status: None   Collection Time: 12/21/18  1:11 AM   Specimen: Nasopharyngeal Swab  Result Value Ref Range Status   SARS Coronavirus 2 NEGATIVE NEGATIVE Final    Comment: (NOTE) If result is NEGATIVE SARS-CoV-2 target nucleic acids are NOT DETECTED. The SARS-CoV-2 RNA is generally detectable in upper and lower  respiratory specimens during the acute phase of infection. The lowest  concentration of SARS-CoV-2 viral copies this assay can detect is 250  copies / mL. A negative result does not preclude SARS-CoV-2 infection  and should not be used as the sole basis for treatment or other  patient management decisions.  A negative result may occur with  improper specimen collection / handling, submission of specimen other  than nasopharyngeal swab, presence of viral mutation(s) within the  areas targeted by this assay, and inadequate number of viral copies  (<250 copies / mL). A negative result must be combined with clinical  observations, patient history, and epidemiological information. If result is POSITIVE  SARS-CoV-2 target nucleic acids are DETECTED. The SARS-CoV-2 RNA is generally detectable in upper and lower  respiratory specimens dur ing the acute phase of infection.  Positive  results are indicative of active infection with SARS-CoV-2.  Clinical  correlation with patient history and other diagnostic information is  necessary to determine patient infection status.  Positive results do  not rule out bacterial infection or co-infection with other viruses. If result is PRESUMPTIVE POSTIVE SARS-CoV-2 nucleic acids MAY BE PRESENT.   A presumptive positive result was obtained on the submitted specimen  and confirmed on repeat testing.  While 2019 novel coronavirus  (SARS-CoV-2) nucleic acids may be present  in the submitted sample  additional confirmatory testing may be necessary for epidemiological  and / or clinical management purposes  to differentiate between  SARS-CoV-2 and other Sarbecovirus currently known to infect humans.  If clinically indicated additional testing with an alternate test  methodology [phone]) is advised. The SARS-CoV-2 RNA is generally  detectable in upper and lower respiratory sp ecimens during the acute  phase of infection. The expected result is Negative. Fact Sheet for Patients:  StrictlyIdeas.no Fact Sheet for Healthcare Providers: BankingDealers.co.za This test is not yet approved or cleared by the [place] FDA and has been authorized for detection and/or diagnosis of SARS-CoV-2 by FDA under an Emergency Use Authorization (EUA).  This EUA will remain in effect (meaning this test can be used) for the duration of the COVID-19 declaration under Section 564(b)(1) of the Act, 21 U.S.C. section 360bbb-3(b)(1), unless the authorization is terminated or revoked sooner. Performed at [facility], [place] [address] [place], [place] [address]      Labs: Basic Metabolic Panel: Recent Labs  Lab 12/20/18 2148 12/21/18 0338  NA 135 136  K 3.8 3.7  CL 101 102  CO2 21* 23  GLUCOSE 146* 142*  BUN 19 21*  CREATININE 1.30* 1.21  CALCIUM 9.4 9.1   Liver Function Tests: Recent Labs  Lab 12/21/18 0338  AST 31  ALT 37  ALKPHOS 51  BILITOT 0.4  PROT 6.2*  ALBUMIN 3.6   No results for input(s): LIPASE, AMYLASE in the last 168 hours. No results for input(s): AMMONIA in the last 168 hours. CBC: Recent Labs  Lab 12/20/18 2148 12/21/18 0338  WBC 9.1 6.3  NEUTROABS 5.2  --   HGB 14.4 13.9  HCT 42.6 39.0  MCV 91.2 91.5  PLT 178 161   Cardiac Enzymes: No results for input(s): CKTOTAL, CKMB, CKMBINDEX, TROPONINI in the last 168 hours. BNP: BNP (last 3 results) No results for input(s): BNP in the  last 8760 hours.  ProBNP (last 3 results) No results for input(s): PROBNP in the last 8760 hours.  CBG: No results for input(s): GLUCAP in the last 168 hours.     Signed:  Nita Sells MD   [facility] 12/21/2018, 4:22 PM
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
## 2018-12-21 NOTE — Progress Notes (Addendum)
2D echo reviewed with Dr. Stanford Breed (DOD). EF normal. + diastolic dysfunction likely due to pt's hx of HTN/obesity. RV is normal. There was borderline dilation of IVC but >50% respiratory variability and normal RA pressure. Reviewed with Dr. Stanford Breed - dilated IVC felt only borderline, of no significance clinically at this time. Discussed results with patient. He reports feeling fine at this time. He ambulated without any symptoms. OK to be discharged from cardiac standpoint. I have sent a message to our office's scheduling team requesting a stress test and follow-up appointment, and our office will call the patient with this information. Stress test instructions posted in AVS. Also notified IM.  Dayna Dunn PA-C

## 2018-12-31 ENCOUNTER — Telehealth (HOSPITAL_COMMUNITY): Payer: Self-pay | Admitting: *Deleted

## 2018-12-31 NOTE — Telephone Encounter (Signed)
Patient given detailed instructions per Myocardial Perfusion Study Information Sheet for the test on 01/02/19 at 11:15. Patient notified to arrive 15 minutes early and that it is imperative to arrive on time for appointment to keep from having the test rescheduled.  If you need to cancel or reschedule your appointment, please call the office within 24 hours of your appointment. . Patient verbalized understanding.Christian Mcclain

## 2019-01-02 ENCOUNTER — Other Ambulatory Visit: Payer: Self-pay

## 2019-01-02 ENCOUNTER — Encounter (HOSPITAL_COMMUNITY): Payer: Self-pay

## 2019-01-02 ENCOUNTER — Ambulatory Visit (HOSPITAL_COMMUNITY): Payer: BLUE CROSS/BLUE SHIELD | Attending: Physician Assistant

## 2019-01-02 DIAGNOSIS — R0789 Other chest pain: Secondary | ICD-10-CM | POA: Diagnosis present

## 2019-01-02 LAB — MYOCARDIAL PERFUSION IMAGING
LV dias vol: 75 mL (ref 62–150)
LV sys vol: 33 mL
Peak HR: 114 {beats}/min
Rest HR: 89 {beats}/min
SDS: 0
SRS: 0
SSS: 0
TID: 0.9

## 2019-01-02 MED ORDER — REGADENOSON 0.4 MG/5ML IV SOLN
0.4000 mg | Freq: Once | INTRAVENOUS | Status: AC
  Administered 2019-01-02: 0.4 mg via INTRAVENOUS

## 2019-01-02 MED ORDER — TECHNETIUM TC 99M TETROFOSMIN IV KIT
31.5000 | PACK | Freq: Once | INTRAVENOUS | Status: AC | PRN
  Administered 2019-01-02: 31.5 via INTRAVENOUS
  Filled 2019-01-02: qty 32

## 2019-01-02 MED ORDER — TECHNETIUM TC 99M TETROFOSMIN IV KIT
10.7000 | PACK | Freq: Once | INTRAVENOUS | Status: AC | PRN
  Administered 2019-01-02: 10.7 via INTRAVENOUS
  Filled 2019-01-02: qty 11

## 2019-01-03 ENCOUNTER — Telehealth: Payer: Self-pay | Admitting: Cardiovascular Disease

## 2019-01-03 ENCOUNTER — Ambulatory Visit (INDEPENDENT_AMBULATORY_CARE_PROVIDER_SITE_OTHER): Payer: BLUE CROSS/BLUE SHIELD | Admitting: Cardiology

## 2019-01-03 ENCOUNTER — Encounter: Payer: Self-pay | Admitting: Cardiology

## 2019-01-03 VITALS — BP 122/76 | HR 96 | Ht 75.0 in | Wt 269.0 lb

## 2019-01-03 DIAGNOSIS — F17200 Nicotine dependence, unspecified, uncomplicated: Secondary | ICD-10-CM

## 2019-01-03 DIAGNOSIS — I10 Essential (primary) hypertension: Secondary | ICD-10-CM | POA: Diagnosis not present

## 2019-01-03 DIAGNOSIS — R079 Chest pain, unspecified: Secondary | ICD-10-CM

## 2019-01-03 NOTE — Telephone Encounter (Signed)
New message:    patient calling stating that some one called him today and I did not see a note. Please call patient.

## 2019-01-03 NOTE — Patient Instructions (Signed)
Medication Instructions:  Your physician recommends that you continue on your current medications as directed. Please refer to the Current Medication list given to you today.  If you need a refill on your cardiac medications before your next appointment, please call your pharmacy.   Lab work: None.  If you have labs (blood work) drawn today and your tests are completely normal, you will receive your results only by: Marland Kitchen MyChart Message (if you have MyChart) OR . A paper copy in the mail If you have any lab test that is abnormal or we need to change your treatment, we will call you to review the results.  Testing/Procedures: None.   Follow-Up: At [facility], you and your health needs are our priority.  As part of our continuing mission to provide you with exceptional heart care, we have created designated Provider Care Teams.  These Care Teams include your primary Cardiologist (physician) and Advanced Practice Providers (APPs -  Physician Assistants and Nurse Practitioners) who all work together to provide you with the care you need, when you need it. You will need a follow up appointment in 6 months.  Please call our office 2 months in advance to schedule this appointment.  You may see Jenkins Rouge, MD or another member of our [place] Provider Team in [place]: Shirlee More, MD . Jyl Heinz, MD  Any Other Special Instructions Will Be Listed Below (If Applicable).
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
## 2019-01-03 NOTE — Telephone Encounter (Signed)
Called patient back he already heard from [place] office with results from testing. No further questions.

## 2019-01-09 ENCOUNTER — Ambulatory Visit: Payer: BLUE CROSS/BLUE SHIELD | Admitting: Cardiology

## 2019-03-27 ENCOUNTER — Other Ambulatory Visit: Payer: Self-pay

## 2019-03-27 ENCOUNTER — Ambulatory Visit (HOSPITAL_COMMUNITY)
Admission: EM | Admit: 2019-03-27 | Discharge: 2019-03-27 | Disposition: A | Discharge status: Home / Self Care | Payer: BLUE CROSS/BLUE SHIELD | Attending: Family Medicine | Admitting: Family Medicine

## 2019-03-27 ENCOUNTER — Encounter (HOSPITAL_COMMUNITY): Payer: Self-pay

## 2019-03-27 DIAGNOSIS — M109 Gout, unspecified: Secondary | ICD-10-CM | POA: Diagnosis not present

## 2019-03-27 MED ORDER — PREDNISONE 10 MG (21) PO TBPK: ORAL_TABLET | Freq: Every day | ORAL | 0 refills | Status: DC

## 2019-03-27 NOTE — Discharge Instructions (Signed)
Take the prednisone as directed.  Take all of day 1 today (3 now and 3 at bedtime) Elevate foot.  Limit walking. Talk to your primary care doctor about your medications.
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
## 2019-03-27 NOTE — ED Triage Notes (Signed)
Patient presents to Urgent Care with complaints of left foot swelling since two days ago. Patient reports it is red on the bottom, is slightly painful, no hx of gout.

## 2019-04-03 ENCOUNTER — Other Ambulatory Visit: Payer: Self-pay

## 2019-04-03 ENCOUNTER — Ambulatory Visit (HOSPITAL_COMMUNITY)
Admission: EM | Admit: 2019-04-03 | Discharge: 2019-04-03 | Disposition: A | Discharge status: Home / Self Care | Payer: BLUE CROSS/BLUE SHIELD | Attending: Emergency Medicine | Admitting: Emergency Medicine

## 2019-04-03 ENCOUNTER — Ambulatory Visit (INDEPENDENT_AMBULATORY_CARE_PROVIDER_SITE_OTHER): Payer: BLUE CROSS/BLUE SHIELD

## 2019-04-03 ENCOUNTER — Encounter (HOSPITAL_COMMUNITY): Payer: Self-pay

## 2019-04-03 DIAGNOSIS — M25511 Pain in right shoulder: Secondary | ICD-10-CM | POA: Diagnosis not present

## 2019-04-03 MED ORDER — IBUPROFEN 800 MG PO TABS: 800.0000 mg | ORAL_TABLET | Freq: Three times a day (TID) | ORAL | 0 refills | Status: AC | PRN
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
## 2019-04-03 NOTE — ED Triage Notes (Signed)
Pt presents with right shoulder and right arm pain after falling while trying to put his lawnmower away yesterday.

## 2019-04-03 NOTE — Discharge Instructions (Addendum)
Take the ibuprofen as prescribed.  Rest your shoulder.  Apply ice packs 2-3 times a day for up to 20 minutes each.  Wear the sling as needed for comfort.    Follow up with your primary care provider or an orthopedist if you symptoms continue or worsen;  Or if you develop new symptoms, such as numbness, tingling, or weakness.    Your blood pressure is elevated today at 150/103.  Please have this rechecked by your primary care provider in 2-4 weeks.  If you do not have a primary care provider, one is suggested below.

## 2019-06-30 ENCOUNTER — Ambulatory Visit: Payer: BLUE CROSS/BLUE SHIELD | Admitting: Cardiology

## 2019-11-01 ENCOUNTER — Emergency Department (HOSPITAL_COMMUNITY)
Admission: EM | Admit: 2019-11-01 | Discharge: 2019-11-01 | Disposition: A | Discharge status: Home / Self Care | Payer: BLUE CROSS/BLUE SHIELD | Attending: Emergency Medicine | Admitting: Emergency Medicine

## 2019-11-01 ENCOUNTER — Other Ambulatory Visit: Payer: Self-pay

## 2019-11-01 ENCOUNTER — Encounter (HOSPITAL_COMMUNITY): Payer: Self-pay | Admitting: Emergency Medicine

## 2019-11-01 ENCOUNTER — Emergency Department (HOSPITAL_COMMUNITY): Payer: BLUE CROSS/BLUE SHIELD

## 2019-11-01 DIAGNOSIS — I10 Essential (primary) hypertension: Secondary | ICD-10-CM | POA: Diagnosis not present

## 2019-11-01 DIAGNOSIS — M545 Low back pain, unspecified: Secondary | ICD-10-CM

## 2019-11-01 DIAGNOSIS — Z79899 Other long term (current) drug therapy: Secondary | ICD-10-CM | POA: Diagnosis not present

## 2019-11-01 DIAGNOSIS — M542 Cervicalgia: Secondary | ICD-10-CM | POA: Diagnosis present

## 2019-11-01 DIAGNOSIS — F17210 Nicotine dependence, cigarettes, uncomplicated: Secondary | ICD-10-CM | POA: Insufficient documentation

## 2019-11-01 DIAGNOSIS — Y929 Unspecified place or not applicable: Secondary | ICD-10-CM | POA: Diagnosis not present

## 2019-11-01 DIAGNOSIS — Y939 Activity, unspecified: Secondary | ICD-10-CM | POA: Insufficient documentation

## 2019-11-01 DIAGNOSIS — Y999 Unspecified external cause status: Secondary | ICD-10-CM | POA: Diagnosis not present

## 2019-11-01 DIAGNOSIS — M25511 Pain in right shoulder: Secondary | ICD-10-CM | POA: Diagnosis not present

## 2019-11-01 MED ORDER — METHOCARBAMOL 500 MG PO TABS
500.0000 mg | ORAL_TABLET | Freq: Every evening | ORAL | 0 refills | Status: DC | PRN
Start: 2019-11-01 — End: 2019-11-01

## 2019-11-01 MED ORDER — METHOCARBAMOL 500 MG PO TABS: 500.0000 mg | ORAL_TABLET | Freq: Every evening | ORAL | 0 refills | Status: AC | PRN

## 2019-11-01 NOTE — Discharge Instructions (Signed)
Take ibuprofen 3 times a day with meals.  Do not take other anti-inflammatories at the same time (Advil, Motrin, naproxen, Aleve). You may supplement with Tylenol if you need further pain control. Use robaxin as needed for muscle stiffness or soreness.  Have caution, this may make you tired or groggy.  Do not drive or operate heavy machinery while taking this medicine. Use ice packs or heating pads if this helps control your pain. You will likely have continued muscle stiffness and soreness over the next couple days.   With your primary care doctor within the next week for recheck of your symptoms, specifically your neck.  Your CT imaging today how long of motion/movement, and thus was difficult to interpret.  If you continue to have pain, you will likely need further imaging.   Return to the emergency room if you develop weakness, numbness, worsening pain, or any new worsening, or concerning symptoms.
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
## 2019-11-01 NOTE — ED Notes (Signed)
Patient Alert and oriented to baseline. Stable and ambulatory to baseline. Patient verbalized understanding of the discharge instructions.  Patient belongings were taken by the patient.   
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
## 2020-01-25 ENCOUNTER — Emergency Department (HOSPITAL_COMMUNITY)
Admission: EM | Admit: 2020-01-25 | Discharge: 2020-01-25 | Disposition: A | Discharge status: Home / Self Care | Payer: BLUE CROSS/BLUE SHIELD | Attending: Emergency Medicine | Admitting: Emergency Medicine

## 2020-01-25 ENCOUNTER — Other Ambulatory Visit: Payer: Self-pay

## 2020-01-25 ENCOUNTER — Emergency Department (HOSPITAL_COMMUNITY): Payer: BLUE CROSS/BLUE SHIELD

## 2020-01-25 ENCOUNTER — Encounter (HOSPITAL_COMMUNITY): Payer: Self-pay

## 2020-01-25 DIAGNOSIS — F17200 Nicotine dependence, unspecified, uncomplicated: Secondary | ICD-10-CM | POA: Diagnosis not present

## 2020-01-25 DIAGNOSIS — Z7984 Long term (current) use of oral hypoglycemic drugs: Secondary | ICD-10-CM | POA: Diagnosis not present

## 2020-01-25 DIAGNOSIS — M109 Gout, unspecified: Secondary | ICD-10-CM

## 2020-01-25 DIAGNOSIS — E119 Type 2 diabetes mellitus without complications: Secondary | ICD-10-CM | POA: Diagnosis not present

## 2020-01-25 DIAGNOSIS — Z880 Allergy status to penicillin: Secondary | ICD-10-CM | POA: Insufficient documentation

## 2020-01-25 DIAGNOSIS — Z79899 Other long term (current) drug therapy: Secondary | ICD-10-CM | POA: Diagnosis not present

## 2020-01-25 DIAGNOSIS — M10071 Idiopathic gout, right ankle and foot: Secondary | ICD-10-CM | POA: Insufficient documentation

## 2020-01-25 DIAGNOSIS — R112 Nausea with vomiting, unspecified: Secondary | ICD-10-CM | POA: Insufficient documentation

## 2020-01-25 DIAGNOSIS — I10 Essential (primary) hypertension: Secondary | ICD-10-CM | POA: Diagnosis not present

## 2020-01-25 DIAGNOSIS — M79671 Pain in right foot: Secondary | ICD-10-CM | POA: Diagnosis present

## 2020-01-25 MED ORDER — OXYCODONE-ACETAMINOPHEN 5-325 MG PO TABS: 1.0000 | ORAL_TABLET | Freq: Four times a day (QID) | ORAL | 0 refills | Status: DC | PRN

## 2020-01-25 MED ORDER — OMEPRAZOLE 20 MG PO CPDR: 20.0000 mg | DELAYED_RELEASE_CAPSULE | Freq: Every day | ORAL | 0 refills | Status: AC

## 2020-01-25 MED ORDER — OXYCODONE-ACETAMINOPHEN 5-325 MG PO TABS
1.0000 | ORAL_TABLET | Freq: Once | ORAL | Status: AC
  Administered 2020-01-25: 1 via ORAL
  Filled 2020-01-25: qty 1

## 2020-01-25 MED ORDER — KETOROLAC TROMETHAMINE 30 MG/ML IJ SOLN
30.0000 mg | Freq: Once | INTRAMUSCULAR | Status: AC
  Administered 2020-01-25: 30 mg via INTRAMUSCULAR
  Filled 2020-01-25: qty 1

## 2020-01-25 MED ORDER — METHYLPREDNISOLONE 4 MG PO TBPK
ORAL_TABLET | ORAL | 0 refills | Status: DC
Start: 2020-01-25 — End: 2020-03-17

## 2020-01-25 NOTE — Discharge Instructions (Signed)
You were seen today for toe pain.  This is likely acute gout.  Take medications as perscribed.  The dosepak may worsen your GI symptoms.  If so, you should discontinue.  Avoid alcohol and pork products.
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
## 2020-01-25 NOTE — ED Triage Notes (Signed)
Patient complains of abdominal cramping with nausea,vomiting that started on Monday. That has resolved after seeing MD now having right foot pain and swelling and pain to ambulate. NAD

## 2020-03-17 ENCOUNTER — Other Ambulatory Visit: Payer: Self-pay

## 2020-03-17 ENCOUNTER — Emergency Department (HOSPITAL_COMMUNITY)
Admission: EM | Admit: 2020-03-17 | Discharge: 2020-03-17 | Disposition: A | Discharge status: Home / Self Care | Payer: BLUE CROSS/BLUE SHIELD | Attending: Emergency Medicine | Admitting: Emergency Medicine

## 2020-03-17 ENCOUNTER — Encounter (HOSPITAL_COMMUNITY): Payer: Self-pay | Admitting: *Deleted

## 2020-03-17 DIAGNOSIS — M109 Gout, unspecified: Secondary | ICD-10-CM | POA: Diagnosis not present

## 2020-03-17 DIAGNOSIS — M79674 Pain in right toe(s): Secondary | ICD-10-CM | POA: Diagnosis present

## 2020-03-17 DIAGNOSIS — R7303 Prediabetes: Secondary | ICD-10-CM | POA: Insufficient documentation

## 2020-03-17 DIAGNOSIS — Z7984 Long term (current) use of oral hypoglycemic drugs: Secondary | ICD-10-CM | POA: Insufficient documentation

## 2020-03-17 DIAGNOSIS — Z79899 Other long term (current) drug therapy: Secondary | ICD-10-CM | POA: Insufficient documentation

## 2020-03-17 DIAGNOSIS — F17200 Nicotine dependence, unspecified, uncomplicated: Secondary | ICD-10-CM | POA: Insufficient documentation

## 2020-03-17 LAB — CBG MONITORING, ED: Glucose-Capillary: 119 mg/dL — ABNORMAL HIGH (ref 70–99)

## 2020-03-17 MED ORDER — COLCHICINE 0.6 MG PO TABS: ORAL_TABLET | ORAL | 0 refills | Status: AC

## 2020-03-17 MED ORDER — OXYCODONE-ACETAMINOPHEN 5-325 MG PO TABS: 1.0000 | ORAL_TABLET | Freq: Four times a day (QID) | ORAL | 0 refills | Status: AC | PRN

## 2020-03-17 MED ORDER — METHYLPREDNISOLONE 4 MG PO TBPK
ORAL_TABLET | ORAL | 0 refills | Status: AC
Start: 2020-03-17 — End: ?

## 2020-03-17 NOTE — ED Triage Notes (Signed)
To ED for eval of right great toe swelling and pain. States he has had same in past and dx with gout. Right pedal pulses palpable. Sensation to toe present and wnl. No noted wound. Noted diabetic.
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
## 2022-04-14 IMAGING — DX DG TOE GREAT 2+V*R*
3 series · 3 of 3 positions shown · non-contrast
Comparison: None.

CLINICAL DATA: Pain.  No known injury.  Swelling.

EXAM:
RIGHT GREAT TOE

[toe ap]
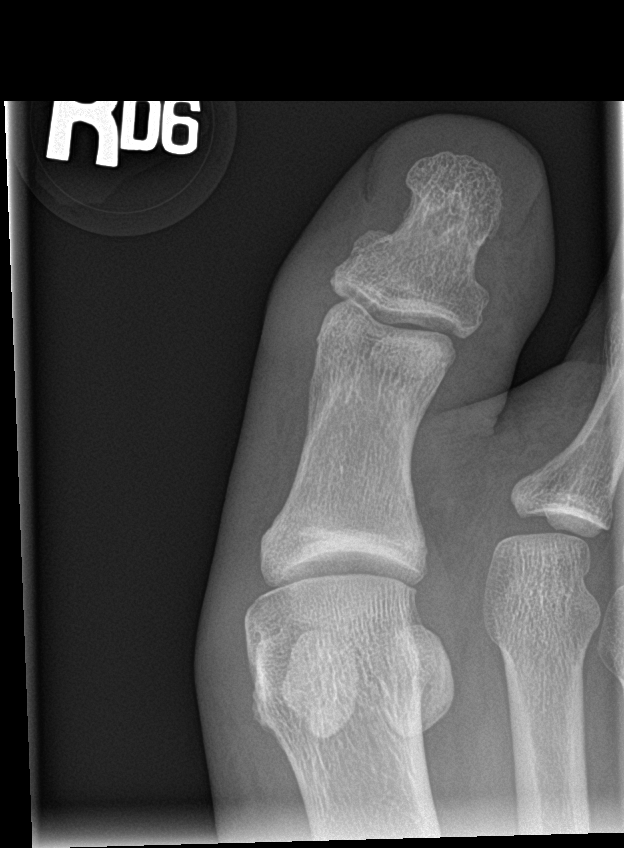

[toe obl]
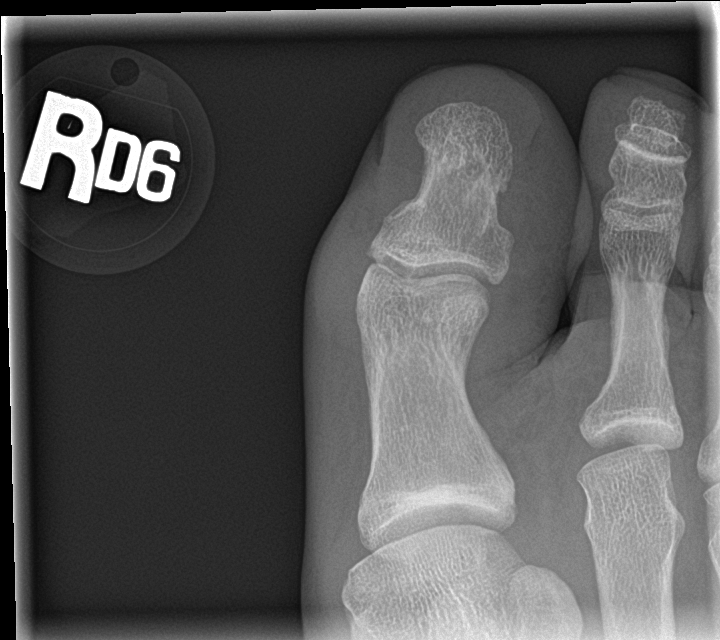

[toe lat]
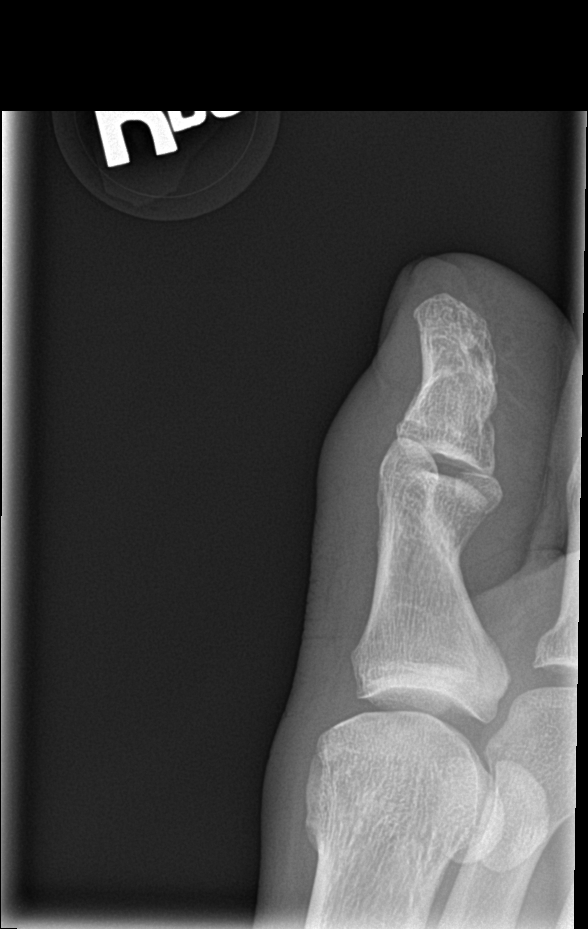

[3 of 3 positions shown; findings below may reference images not displayed]

FINDINGS: No fracture. Soft tissue swelling in the great toe. No bony erosion
identified.
IMPRESSION: Soft tissue swelling.  No bony abnormality identified.
